# Patient Record
Sex: Female | Born: 1975 | Race: Black or African American | Hispanic: No | Smoking: Never smoker
Health system: Southern US, Community
[De-identification: ages and names within clinical notes are randomized; demographics above are authoritative.]

---

## 2003-08-31 ENCOUNTER — Emergency Department (HOSPITAL_COMMUNITY): Admission: EM | Admit: 2003-08-31 | Discharge: 2003-08-31 | Payer: Self-pay | Admitting: Emergency Medicine

## 2004-07-24 ENCOUNTER — Ambulatory Visit: Payer: Self-pay | Admitting: Internal Medicine

## 2004-07-29 ENCOUNTER — Ambulatory Visit: Payer: Self-pay | Admitting: Internal Medicine

## 2005-02-03 ENCOUNTER — Emergency Department (HOSPITAL_COMMUNITY): Admission: EM | Admit: 2005-02-03 | Discharge: 2005-02-03 | Payer: Self-pay | Admitting: Emergency Medicine

## 2007-08-17 ENCOUNTER — Emergency Department (HOSPITAL_COMMUNITY): Admission: EM | Admit: 2007-08-17 | Discharge: 2007-08-17 | Payer: Self-pay | Admitting: Emergency Medicine

## 2008-03-13 ENCOUNTER — Other Ambulatory Visit: Admission: RE | Admit: 2008-03-13 | Discharge: 2008-03-13 | Payer: Self-pay | Admitting: Family Medicine

## 2009-01-08 ENCOUNTER — Emergency Department (HOSPITAL_COMMUNITY): Admission: EM | Admit: 2009-01-08 | Discharge: 2009-01-08 | Payer: Self-pay | Admitting: Emergency Medicine

## 2011-03-05 ENCOUNTER — Emergency Department (HOSPITAL_COMMUNITY)
Admission: EM | Admit: 2011-03-05 | Discharge: 2011-03-05 | Disposition: A | Discharge status: Home / Self Care | Payer: Self-pay | Attending: Emergency Medicine | Admitting: Emergency Medicine

## 2011-03-05 DIAGNOSIS — J3489 Other specified disorders of nose and nasal sinuses: Secondary | ICD-10-CM | POA: Insufficient documentation

## 2011-03-05 DIAGNOSIS — J329 Chronic sinusitis, unspecified: Secondary | ICD-10-CM | POA: Insufficient documentation

## 2016-12-20 ENCOUNTER — Emergency Department (HOSPITAL_BASED_OUTPATIENT_CLINIC_OR_DEPARTMENT_OTHER)
Admit: 2016-12-20 | Discharge: 2016-12-20 | Disposition: A | Discharge status: Home / Self Care | Payer: BC Managed Care – PPO

## 2016-12-20 ENCOUNTER — Emergency Department (HOSPITAL_COMMUNITY): Payer: BC Managed Care – PPO

## 2016-12-20 ENCOUNTER — Emergency Department (HOSPITAL_COMMUNITY)
Admission: EM | Admit: 2016-12-20 | Discharge: 2016-12-20 | Disposition: A | Discharge status: Home / Self Care | Payer: BC Managed Care – PPO | Attending: Emergency Medicine | Admitting: Emergency Medicine

## 2016-12-20 ENCOUNTER — Encounter (HOSPITAL_COMMUNITY): Payer: Self-pay | Admitting: Emergency Medicine

## 2016-12-20 DIAGNOSIS — Z79899 Other long term (current) drug therapy: Secondary | ICD-10-CM | POA: Diagnosis not present

## 2016-12-20 DIAGNOSIS — M25561 Pain in right knee: Secondary | ICD-10-CM | POA: Diagnosis present

## 2016-12-20 DIAGNOSIS — M79609 Pain in unspecified limb: Secondary | ICD-10-CM

## 2016-12-20 DIAGNOSIS — M79604 Pain in right leg: Secondary | ICD-10-CM | POA: Diagnosis not present

## 2016-12-20 MED ORDER — KETOROLAC TROMETHAMINE 60 MG/2ML IM SOLN
60.0000 mg | Freq: Once | INTRAMUSCULAR | Status: AC
  Administered 2016-12-20: 60 mg via INTRAMUSCULAR
  Filled 2016-12-20: qty 2

## 2016-12-20 MED ORDER — MELOXICAM 7.5 MG PO TABS: 7.5000 mg | ORAL_TABLET | Freq: Every day | ORAL | 0 refills | Status: DC | PRN

## 2016-12-20 MED ORDER — METHOCARBAMOL 500 MG PO TABS: 500.0000 mg | ORAL_TABLET | Freq: Four times a day (QID) | ORAL | 0 refills | Status: DC | PRN

## 2016-12-20 MED ORDER — HYDROCODONE-ACETAMINOPHEN 5-325 MG PO TABS
1.0000 | ORAL_TABLET | Freq: Once | ORAL | Status: AC
  Administered 2016-12-20: 1 via ORAL
  Filled 2016-12-20: qty 1

## 2016-12-20 MED ORDER — LIDOCAINE 5 % EX PTCH: 1.0000 | MEDICATED_PATCH | CUTANEOUS | 0 refills | Status: DC

## 2016-12-20 NOTE — ED Notes (Signed)
Vascular tech at bedside. °

## 2016-12-20 NOTE — ED Notes (Signed)
Patient transported to X-ray 

## 2016-12-20 NOTE — Discharge Instructions (Signed)
Read the information below.  Use the prescribed medication as directed.  Please discuss all new medications with your pharmacist.  You may return to the Emergency Department at any time for worsening condition or any new symptoms that concern you.    If you develop fevers, loss of control of bowel or bladder, weakness or numbness in your legs, or are unable to walk, return to the ER for a recheck.  °

## 2016-12-20 NOTE — ED Triage Notes (Signed)
Pt from home with complaints of right knee pain that she thinks began in September when she fell off a ladder. Pt states pain got much worse in May. Pt states the pain is so severe she is now unable to sleep. Pt states she she has been seen a few times for this and has been told it is sciatic pain. Pt states however that most of the pain sits in her knee and only occasionally travels down her leg. Pt states she had tried tramadol and steroids at her regular doctor. Pt states her pain is decreased when she stands and worse when she sits.

## 2016-12-20 NOTE — ED Provider Notes (Signed)
[facility]-EMERGENCY DEPT Provider Note   CSN: [id number] Arrival date & time: 12/20/16  1317   By signing my name below, I, Kari Potts, attest that this documentation has been prepared under the direction and in the presence of [facility], PA-C. Electronically Signed: Clarisse Potts, Scribe. 12/20/16. 2:25 PM.   History   Chief Complaint Chief Complaint  Patient presents with  . Knee Pain   The history is provided by the patient and medical records. No language interpreter was used.    Kari Potts is an otherwise healthy 41 y.o. female presenting to the Emergency Department with chief complaint of recurrent, gradually worsening R knee pain since ~10/2016. Pt notes swelling to the R knee. She describes 10/10 stabbing dull throbbing pain in the R knee radiating to the R shin, R thigh, R hip and R lower back with shooting pain and tingling sensation in the toes of the R foot. Pt notes worsening of pain with contact and staying in one position for too long. She notes a recent fall from a ladder that exacerbated her pain. Pt seen at [facility], where she was referred to an orthopedic specialist. She states she has obtained imaging with an orthopedic specialist (Dr [name]) 3-4 weeks ago.  Dr [name] thought the pain was actually radicular from her back. She states she received a prednisone prescription at this time, which she states she took with moderate but temporary relief.  Denies fevers, chills, abdominal pain, loss of control of bowel or bladder, weakness of numbness of the extremities, saddle anesthesia, bowel, urinary, or vaginal complaints.  No hx CA, IVDU.   Denies recent immobilization, exogenous estrogen, leg swelling, history of blood clots.   Does have family hx blood clots with sister and father.    History reviewed. No pertinent past medical history.  There are no active problems to display for this patient.   History reviewed. No pertinent surgical history.  OB History    No data  available       Home Medications    Prior to Admission medications   Medication Sig Start Date End Date Taking? Authorizing Provider  lidocaine (LIDODERM) 5 % Place 1 patch onto the skin daily. Remove & Discard patch within 12 hours or as directed by MD 12/20/16   [name], PA-C  meloxicam (MOBIC) 7.5 MG tablet Take 1-2 tablets (7.5-15 mg total) by mouth daily as needed for pain. 12/20/16   [name], PA-C  methocarbamol (ROBAXIN) 500 MG tablet Take 1-2 tablets (500-1,000 mg total) by mouth every 6 (six) hours as needed (pain). 12/20/16   [name], PA-C    Family History No family history on file.  Social History Social History  Substance Use Topics  . Smoking status: Not on file  . Smokeless tobacco: Not on file  . Alcohol use Not on file     Allergies   Penicillins   Review of Systems Review of Systems  Constitutional: Negative for chills and fever.  Respiratory: Negative for cough and shortness of breath.   Cardiovascular: Negative for chest pain.  Gastrointestinal: Negative for abdominal pain, constipation and diarrhea.  Genitourinary: Negative for decreased urine volume, difficulty urinating, dysuria and hematuria.  Musculoskeletal: Positive for arthralgias, back pain, gait problem and joint swelling.  Skin: Negative for color change and wound.  Neurological: Negative for weakness and numbness.  All other systems reviewed and are negative.    Physical Exam Updated Vital Signs BP (!) 163/98 (BP Location: Left Arm)   Pulse Marland Kitchen)  106   Temp 98.5 F (36.9 C) (Oral)   Resp 16   LMP 11/29/2016   SpO2 100%   Physical Exam  Constitutional: She appears well-developed and well-nourished. No distress.  HENT:  Head: Normocephalic and atraumatic.  Neck: Neck supple.  Pulmonary/Chest: Effort normal.  Abdominal: Soft. She exhibits no distension and no mass. There is no tenderness. There is no rebound and no guarding.  Musculoskeletal: She exhibits tenderness.    Tenderness to the lumbar spine and R buttock. Diffuse R thigh tenderness. Anterior knee tenderness. No focal calf tenderness. No erythema or edema. Gait NL. No crepitus or stepoffs. Strength 5/5, sensation intact, distal pulses intact.       Neurological: She is alert.  Skin: She is not diaphoretic.  Nursing note and vitals reviewed.    ED Treatments / Results  DIAGNOSTIC STUDIES: Oxygen Saturation is 100% on RA, NL by my interpretation.    COORDINATION OF CARE: 2:24 PM-Discussed next steps with pt. Pt verbalized understanding and is agreeable with the plan. Will order medications and imaging.   Labs (all labs ordered are listed, but only abnormal results are displayed) Labs Reviewed - No data to display  EKG  EKG Interpretation None       Radiology Dg Lumbar Spine Complete  Result Date: 12/20/2016 CLINICAL DATA:  Low back pain following fall 2 months ago. Initial encounter. EXAM: LUMBAR SPINE - COMPLETE 4+ VIEW COMPARISON:  None. FINDINGS: There is no evidence of lumbar spine fracture. Alignment is normal. Intervertebral disc spaces are maintained. IMPRESSION: Negative. Electronically Signed   By: Harmon Pier M.D.   On: 12/20/2016 14:51   Dg Knee Complete 4 Views Right  Result Date: 12/20/2016 CLINICAL DATA:  Right anterior knee pain. EXAM: RIGHT KNEE - COMPLETE 4+ VIEW COMPARISON:  None. FINDINGS: No fracture or dislocation. Joint spaces are preserved. No joint effusion. No evidence of chondrocalcinosis. Regional soft tissues appear normal. IMPRESSION: No explanation for patient's anterior knee pain. Electronically Signed   By: Simonne Come M.D.   On: 12/20/2016 14:04   Dg Hip Unilat W Or Wo Pelvis 2-3 Views Right  Result Date: 12/20/2016 CLINICAL DATA:  Right hip pain following fall 2 months ago. Initial encounter. EXAM: DG HIP (WITH OR WITHOUT PELVIS) 2-3V RIGHT COMPARISON:  None. FINDINGS: There is no evidence of hip fracture or dislocation. There is no evidence of  arthropathy or other focal bone abnormality. IMPRESSION: Negative. Electronically Signed   By: Harmon Pier M.D.   On: 12/20/2016 14:50    Procedures Procedures (including critical care time)  Medications Ordered in ED Medications  HYDROcodone-acetaminophen (NORCO/VICODIN) 5-325 MG per tablet 1 tablet (1 tablet Oral Given 12/20/16 1445)  ketorolac (TORADOL) injection 60 mg (60 mg Intramuscular Given 12/20/16 1445)     Initial Impression / Assessment and Plan / ED Course  I have reviewed the triage vital signs and the nursing notes.  Pertinent labs & imaging results that were available during my care of the patient were reviewed by me and considered in my medical decision making (see chart for details).     Afebrile, nontoxic patient with atraumatic pain of the right leg, primarily the right knee.  Negative for DVT.  Xrays negative.  No e/o joint infection.  I agree that this is likely radiculopathy.  Discussed follow up with patient who would like more thorough evaluation - f/u PCP, ortho, vs neurology.  D/C home with symptomatic treatment, outpatient follow up.  Discussed result, findings, treatment, and follow  up  with patient.  Pt given return precautions.  Pt verbalizes understanding and agrees with plan.       Final Clinical Impressions(s) / ED Diagnoses   Final diagnoses:  Right leg pain    New Prescriptions Discharge Medication List as of 12/20/2016  3:27 PM    START taking these medications   Details  lidocaine (LIDODERM) 5 % Place 1 patch onto the skin daily. Remove & Discard patch within 12 hours or as directed by MD, Starting Sat 12/20/2016, Print    meloxicam (MOBIC) 7.5 MG tablet Take 1-2 tablets (7.5-15 mg total) by mouth daily as needed for pain., Starting Sat 12/20/2016, Print    methocarbamol (ROBAXIN) 500 MG tablet Take 1-2 tablets (500-1,000 mg total) by mouth every 6 (six) hours as needed (pain)., Starting Sat 12/20/2016, Print        I personally performed the  services described in this documentation, which was scribed in my presence. The recorded information has been reviewed and is accurate.    Trixie DredgeWest, Joffre Lucks, PA-C 12/20/16 1728    Vanetta MuldersZackowski, Scott, MD 12/22/16 [phone]

## 2016-12-20 NOTE — Progress Notes (Signed)
*  PRELIMINARY RESULTS* Vascular Ultrasound Right lower extremity venous duplex has been completed.  Preliminary findings: No evidence of deep vein thrombosis in the visualized veins of the right lower extremity.  Difficult exam due to patient body habitus and limitations of penetration.  Preliminary results given to [facility] @ 3:05  Chauncey FischerCharlotte C Emiya Loomer 12/20/2016, 3:10 PM

## 2017-07-06 ENCOUNTER — Emergency Department (HOSPITAL_COMMUNITY): Payer: BC Managed Care – PPO

## 2017-07-06 ENCOUNTER — Encounter (HOSPITAL_COMMUNITY): Payer: Self-pay | Admitting: Emergency Medicine

## 2017-07-06 ENCOUNTER — Other Ambulatory Visit: Payer: Self-pay

## 2017-07-06 ENCOUNTER — Emergency Department (HOSPITAL_COMMUNITY)
Admission: EM | Admit: 2017-07-06 | Discharge: 2017-07-07 | Disposition: A | Discharge status: Home / Self Care | Payer: BC Managed Care – PPO | Attending: Emergency Medicine | Admitting: Emergency Medicine

## 2017-07-06 DIAGNOSIS — M7918 Myalgia, other site: Secondary | ICD-10-CM | POA: Insufficient documentation

## 2017-07-06 DIAGNOSIS — R05 Cough: Secondary | ICD-10-CM | POA: Diagnosis not present

## 2017-07-06 DIAGNOSIS — Z79899 Other long term (current) drug therapy: Secondary | ICD-10-CM | POA: Diagnosis not present

## 2017-07-06 DIAGNOSIS — R69 Illness, unspecified: Secondary | ICD-10-CM | POA: Diagnosis not present

## 2017-07-06 DIAGNOSIS — J0100 Acute maxillary sinusitis, unspecified: Secondary | ICD-10-CM | POA: Insufficient documentation

## 2017-07-06 DIAGNOSIS — J3489 Other specified disorders of nose and nasal sinuses: Secondary | ICD-10-CM | POA: Insufficient documentation

## 2017-07-06 DIAGNOSIS — R0602 Shortness of breath: Secondary | ICD-10-CM | POA: Diagnosis present

## 2017-07-06 DIAGNOSIS — Z880 Allergy status to penicillin: Secondary | ICD-10-CM | POA: Insufficient documentation

## 2017-07-06 DIAGNOSIS — H1032 Unspecified acute conjunctivitis, left eye: Secondary | ICD-10-CM | POA: Insufficient documentation

## 2017-07-06 DIAGNOSIS — R0981 Nasal congestion: Secondary | ICD-10-CM | POA: Diagnosis not present

## 2017-07-06 DIAGNOSIS — J111 Influenza due to unidentified influenza virus with other respiratory manifestations: Secondary | ICD-10-CM

## 2017-07-06 DIAGNOSIS — R0982 Postnasal drip: Secondary | ICD-10-CM | POA: Insufficient documentation

## 2017-07-06 MED ORDER — ALBUTEROL SULFATE (2.5 MG/3ML) 0.083% IN NEBU
5.0000 mg | INHALATION_SOLUTION | Freq: Once | RESPIRATORY_TRACT | Status: DC
  Filled 2017-07-06: qty 6

## 2017-07-06 NOTE — ED Triage Notes (Deleted)
Patient is complaining of left chest pain underneath her breast. Patient is also stating that it is hard to breathe. Patient states this started yesterday.

## 2017-07-07 MED ORDER — DEXAMETHASONE 4 MG PO TABS
10.0000 mg | ORAL_TABLET | Freq: Once | ORAL | Status: AC
  Administered 2017-07-07: 01:00:00 10 mg via ORAL
  Filled 2017-07-07: qty 2

## 2017-07-07 MED ORDER — SULFACETAMIDE SODIUM 10 % OP SOLN: 1.0000 [drp] | OPHTHALMIC | 0 refills | Status: AC

## 2017-07-07 MED ORDER — CEPHALEXIN 500 MG PO CAPS: 500.0000 mg | ORAL_CAPSULE | Freq: Four times a day (QID) | ORAL | 0 refills | Status: AC

## 2017-07-07 MED ORDER — CEPHALEXIN 500 MG PO CAPS
1000.0000 mg | ORAL_CAPSULE | Freq: Once | ORAL | Status: AC
  Administered 2017-07-07: 1000 mg via ORAL
  Filled 2017-07-07: qty 2

## 2017-07-07 NOTE — ED Triage Notes (Signed)
Patient complaining of sob and tightness in her chest. Patient states that she has been congested. Patient states that she has been coughing a lot.

## 2017-07-07 NOTE — ED Provider Notes (Signed)
[facility]-EMERGENCY DEPT Provider Note   CSN: [id number] Arrival date & time: 07/06/17  2335     History   Chief Complaint Chief Complaint  Patient presents with  . Shortness of Breath  . Flu Symptoms    HPI Kari Potts is a 41 y.o. female.  The history is provided by the patient.  Shortness of Breath   She has been sick for the last 4 days with low-grade fevers to 100.0, nasal congestion, sore throat, cough.  She has had yellowish rhinorrhea and postnasal drainage which has been blood streaked.  Cough is productive of yellow sputum which is also occasionally blood streaked.  She is also developed some redness of her left eye with some watering.  She has not had any known sick contacts.  She has not had the influenza immunization this season.  She denies chest pain or dyspnea.  She denies nausea or vomiting.  She has had some myalgias.  History reviewed. No pertinent past medical history.  There are no active problems to display for this patient.   History reviewed. No pertinent surgical history.  OB History    No data available       Home Medications    Prior to Admission medications   Medication Sig Start Date End Date Taking? Authorizing Provider  naproxen (NAPROSYN) 500 MG tablet Take 250-500 mg by mouth 2 (two) times daily as needed for mild pain.   Yes [provider]  Phenylephrine-DM-GG-APAP (MUCINEX FAST-MAX COLD FLU) 5-10-200-325 MG/10ML LIQD Take 10 mLs by mouth every 4 (four) hours as needed (for cold).   Yes [provider]  cephALEXin (KEFLEX) 500 MG capsule Take 1 capsule (500 mg total) by mouth 4 (four) times daily. 07/07/17   Dione BoozeGlick, Canary Fister, MD  sulfacetamide (BLEPH-10) 10 % ophthalmic solution Place 1-2 drops into the left eye every 4 (four) hours. 07/07/17   Dione BoozeGlick, Jadien Lehigh, MD    Family History History reviewed. No pertinent family history.  Social History Social History   Tobacco Use  . Smoking  status: Never Smoker  . Smokeless tobacco: Never Used  Substance Use Topics  . Alcohol use: No    Frequency: Never  . Drug use: No     Allergies   Penicillins; Pineapple; and Tylenol [acetaminophen]   Review of Systems Review of Systems  Respiratory: Positive for shortness of breath.   All other systems reviewed and are negative.    Physical Exam Updated Vital Signs BP (!) 175/113 (BP Location: Left Arm)   Pulse 88   Temp 97.9 F (36.6 C) (Oral)   Resp 20   SpO2 98%   Physical Exam  Nursing note and vitals reviewed.  41 year old female, resting comfortably and in no acute distress. Vital signs are significant for hypertension. Oxygen saturation is 98%, which is normal. Head is normocephalic and atraumatic. PERRLA, EOMI. Oropharynx shows moderate tonsillar hypertrophy with erythema but no exudate.  She is tolerating secretions well and there is no hoarseness of her voice.  There is tenderness to palpation over maxillary sinuses.  There is moderate edema of turbinates bilaterally.  There is mild conjunctival injection on the left. Neck is nontender and supple without adenopathy or JVD. Back is nontender and there is no CVA tenderness. Lungs are clear without rales, wheezes, or rhonchi. Chest is nontender. Heart has regular rate and rhythm without murmur. Abdomen is soft, flat, nontender without masses or hepatosplenomegaly and peristalsis is normoactive. Extremities have no cyanosis or edema,  full range of motion is present. Skin is warm and dry without rash. Neurologic: Mental status is normal, cranial nerves are intact, there are no motor or sensory deficits.  ED Treatments / Results  Labs (all labs ordered are listed, but only abnormal results are displayed) Labs Reviewed  I-STAT BETA HCG BLOOD, ED ([facility], [facility], [facility] ONLY)    EKG  EKG Interpretation  Date/Time:  Tuesday July 07 2017 00:08:52 EST Ventricular Rate:  89 PR Interval:    QRS Duration: 92 QT  Interval:  380 QTC Calculation: 463 R Axis:   33 Text Interpretation:  Sinus rhythm Low voltage, precordial leads Baseline wander in lead(s) V2 Otherwise within normal limits No old tracing to compare Confirmed by Dione BoozeGlick, Jacinta Penalver ([id number]) on 07/07/2017 12:13:48 AM       Radiology Dg Chest 2 View  Result Date: 07/07/2017 CLINICAL DATA:  Acute onset of chest and nasal congestion. Chest tightness and fever. EXAM: CHEST  2 VIEW COMPARISON:  None. FINDINGS: The lungs are well-aerated and clear. There is no evidence of focal opacification, pleural effusion or pneumothorax. The heart is normal in size; the mediastinal contour is within normal limits. No acute osseous abnormalities are seen. IMPRESSION: No acute cardiopulmonary process seen. Electronically Signed   By: Roanna RaiderJeffery  Chang M.D.   On: 07/07/2017 00:07    Procedures Procedures (including critical care time)  Medications Ordered in ED Medications  albuterol (PROVENTIL) (2.5 MG/3ML) 0.083% nebulizer solution 5 mg (not administered)  cephALEXin (KEFLEX) capsule 1,000 mg (not administered)  dexamethasone (DECADRON) tablet 10 mg (not administered)     Initial Impression / Assessment and Plan / ED Course  I have reviewed the triage vital signs and the nursing notes.  Pertinent labs & imaging results that were available during my care of the patient were reviewed by me and considered in my medical decision making (see chart for details).  Respiratory tract infection which probably was viral, at least initially.  Current manifestations include conjunctivitis, tonsillitis, rhinitis and cough.  There appears to be a secondary sinusitis.  Because of this, she will be placed on antibiotics.  She is allergic to penicillin, so is discharged with prescription for cephalexin.  Given prescription for sulfacetamide ophthalmic solution for her conjunctivitis.  Advised to use active metabolism for her nasal congestion with the caution to not use for more  than 3 days to prevent rhinitis medicamentosa.  Final Clinical Impressions(s) / ED Diagnoses   Final diagnoses:  Influenza-like illness  Acute non-recurrent maxillary sinusitis  Acute conjunctivitis of left eye, unspecified acute conjunctivitis type    ED Discharge Orders        Ordered    cephALEXin (KEFLEX) 500 MG capsule  4 times daily     07/07/17 0109    sulfacetamide (BLEPH-10) 10 % ophthalmic solution  Every 4 hours     07/07/17 0109       Dione BoozeGlick, Everett Ehrler, MD 07/07/17 0120

## 2017-07-07 NOTE — ED Notes (Signed)
Patient complaining of a sore throat and nasal congestion with some associated chest tightness and intermittent coughing.

## 2017-07-07 NOTE — Discharge Instructions (Signed)
You may use Afrin Nasal Spray, but do not use for more than three days.

## 2019-01-23 IMAGING — CR DG HIP (WITH OR WITHOUT PELVIS) 2-3V*R*
3 series · 3 of 3 positions shown · non-contrast
Comparison: None.

CLINICAL DATA: Right hip pain following fall 2 months ago. Initial
encounter.

EXAM:
DG HIP (WITH OR WITHOUT PELVIS) 2-3V RIGHT

[t pelvis ap]
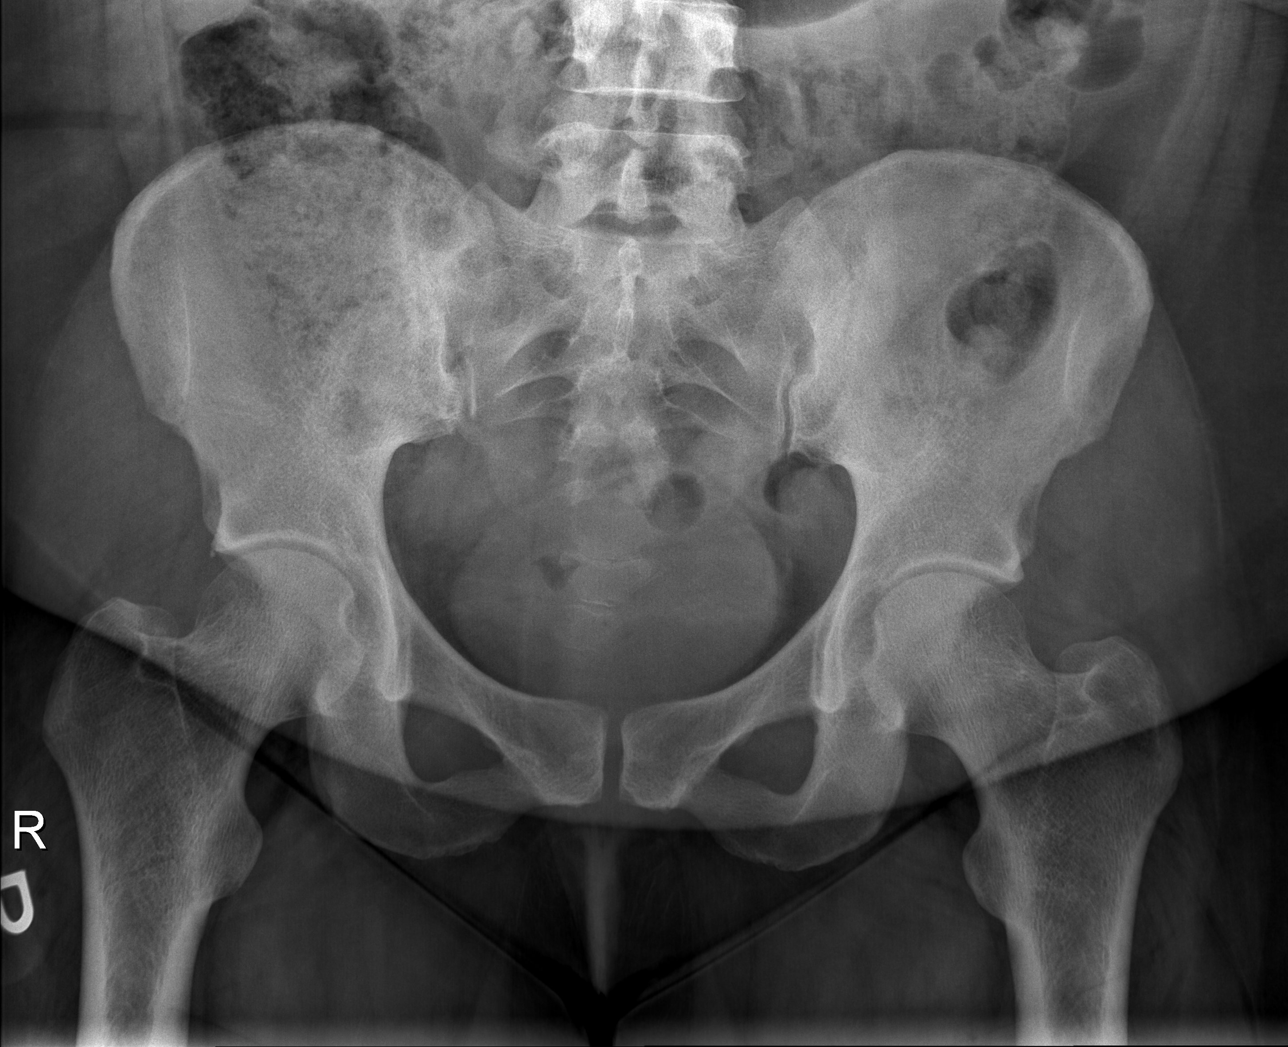

[t hip ap right]
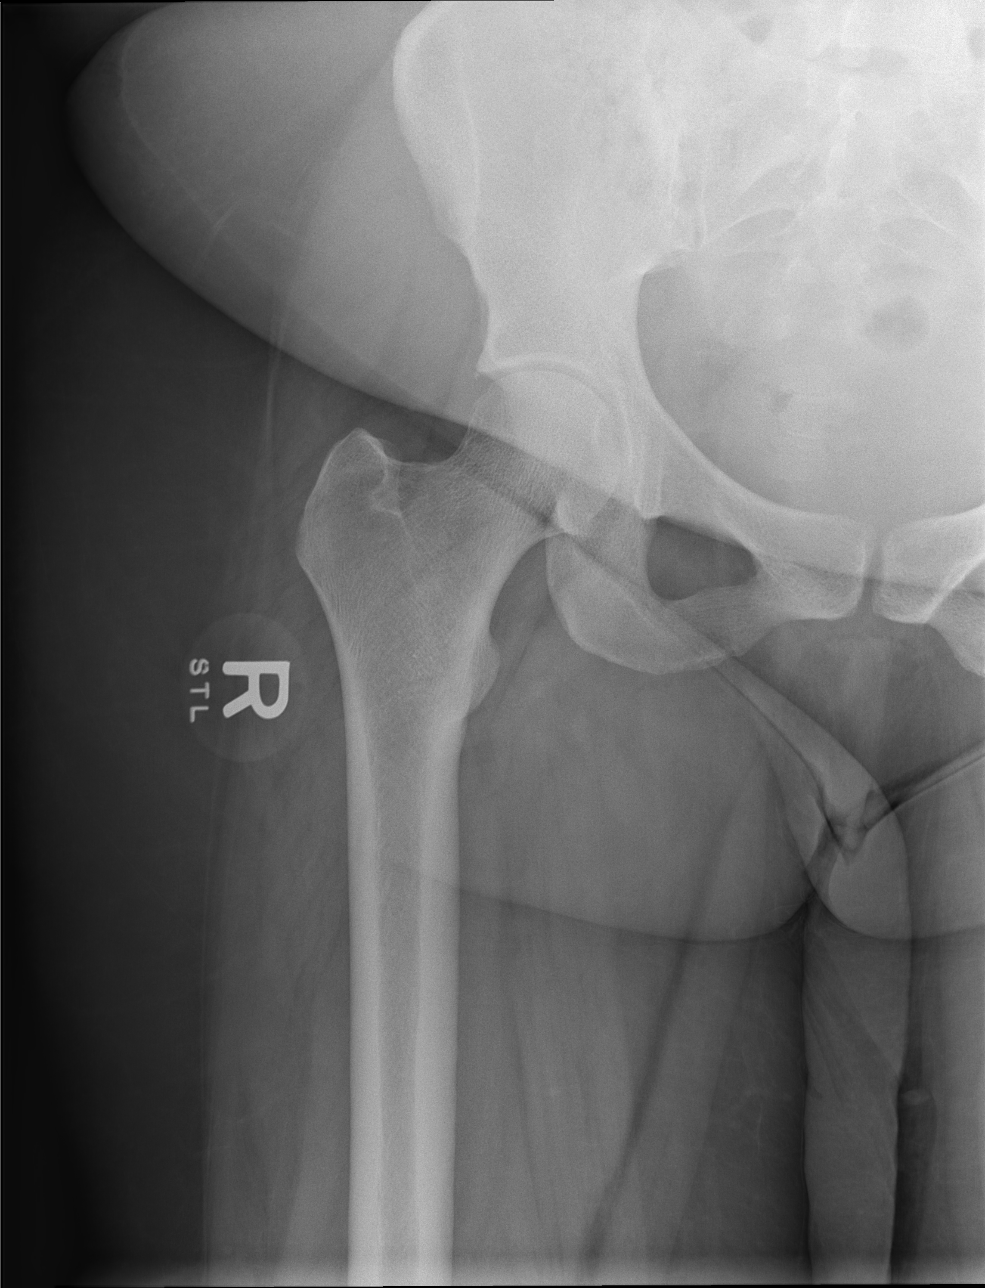

[t hip frog leg right]
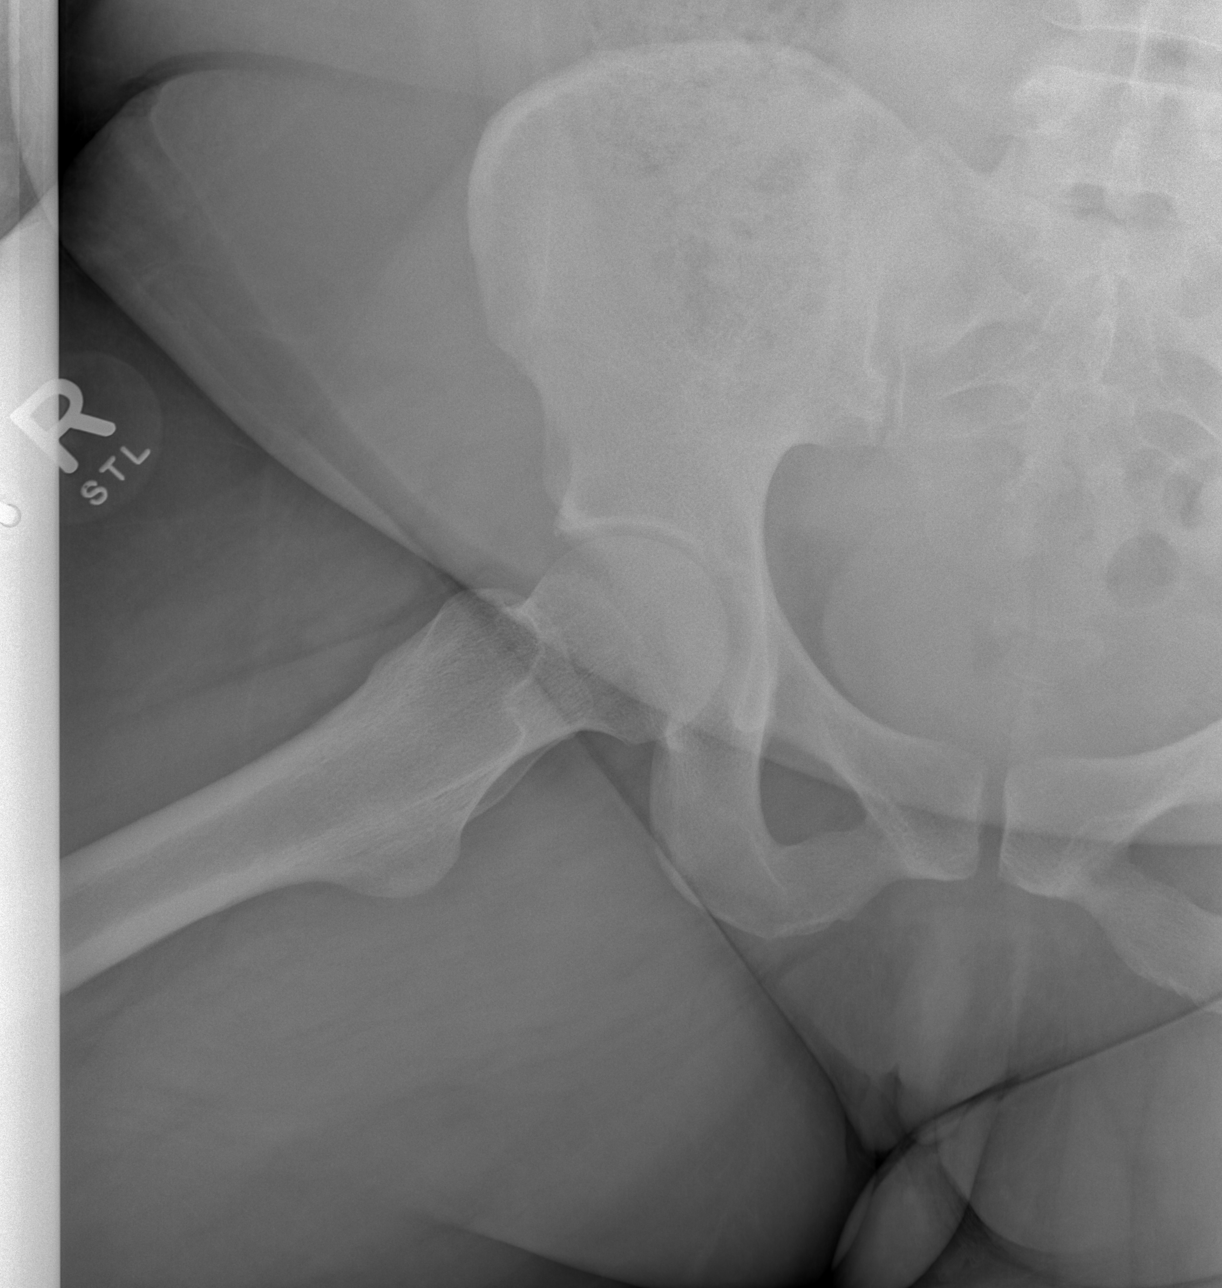

[3 of 3 positions shown; findings below may reference images not displayed]

FINDINGS: There is no evidence of hip fracture or dislocation. There is no
evidence of arthropathy or other focal bone abnormality.
IMPRESSION: Negative.
